# Patient Record
Sex: Male | Born: 1960
Health system: Southern US, Community
[De-identification: ages and names within clinical notes are randomized; demographics above are authoritative.]

## PROBLEM LIST (undated history)

## (undated) DIAGNOSIS — G610 Guillain-Barre syndrome: Secondary | ICD-10-CM

## (undated) DIAGNOSIS — G4730 Sleep apnea, unspecified: Secondary | ICD-10-CM

## (undated) DIAGNOSIS — C4491 Basal cell carcinoma of skin, unspecified: Secondary | ICD-10-CM

## (undated) DIAGNOSIS — I2510 Atherosclerotic heart disease of native coronary artery without angina pectoris: Secondary | ICD-10-CM

## (undated) DIAGNOSIS — I10 Essential (primary) hypertension: Secondary | ICD-10-CM

## (undated) HISTORY — PX: WRIST SURGERY: SHX841

## (undated) HISTORY — PX: KNEE SURGERY: SHX244

## (undated) HISTORY — PX: COLONOSCOPY: SHX174

## (undated) HISTORY — PX: OTHER SURGICAL HISTORY: SHX169

---
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
## 2000-01-04 ENCOUNTER — Ambulatory Visit (HOSPITAL_BASED_OUTPATIENT_CLINIC_OR_DEPARTMENT_OTHER): Admission: RE | Admit: 2000-01-04 | Discharge: 2000-01-04 | Payer: Self-pay | Admitting: Orthopedic Surgery

## 2012-07-19 ENCOUNTER — Other Ambulatory Visit: Payer: Self-pay | Admitting: Occupational Medicine

## 2012-07-19 ENCOUNTER — Ambulatory Visit
Admission: RE | Admit: 2012-07-19 | Discharge: 2012-07-19 | Disposition: A | Discharge status: Home / Self Care | Payer: No Typology Code available for payment source | Source: Ambulatory Visit | Attending: Occupational Medicine | Admitting: Occupational Medicine

## 2012-07-19 DIAGNOSIS — Z021 Encounter for pre-employment examination: Secondary | ICD-10-CM

## 2013-06-22 ENCOUNTER — Emergency Department (HOSPITAL_COMMUNITY)
Admission: EM | Admit: 2013-06-22 | Discharge: 2013-06-22 | Disposition: A | Discharge status: Home / Self Care | Payer: Worker's Compensation | Attending: Emergency Medicine | Admitting: Emergency Medicine

## 2013-06-22 ENCOUNTER — Encounter (HOSPITAL_COMMUNITY): Payer: Self-pay | Admitting: Emergency Medicine

## 2013-06-22 DIAGNOSIS — I10 Essential (primary) hypertension: Secondary | ICD-10-CM | POA: Insufficient documentation

## 2013-06-22 DIAGNOSIS — Z79899 Other long term (current) drug therapy: Secondary | ICD-10-CM | POA: Insufficient documentation

## 2013-06-22 DIAGNOSIS — R450 Nervousness: Secondary | ICD-10-CM | POA: Insufficient documentation

## 2013-06-22 DIAGNOSIS — F418 Other specified anxiety disorders: Secondary | ICD-10-CM

## 2013-06-22 DIAGNOSIS — R002 Palpitations: Secondary | ICD-10-CM | POA: Insufficient documentation

## 2013-06-22 DIAGNOSIS — F411 Generalized anxiety disorder: Secondary | ICD-10-CM | POA: Insufficient documentation

## 2013-06-22 HISTORY — DX: Essential (primary) hypertension: I10

## 2013-06-22 MED ORDER — ALPRAZOLAM 0.25 MG PO TABS: 0.2500 mg | ORAL_TABLET | Freq: Every evening | ORAL | Status: DC | PRN

## 2013-06-22 NOTE — ED Notes (Signed)
EKG given to EDP, Yelverton, MD. 
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
## 2013-12-24 ENCOUNTER — Encounter (HOSPITAL_COMMUNITY): Payer: Self-pay | Admitting: Emergency Medicine

## 2013-12-24 ENCOUNTER — Emergency Department (HOSPITAL_COMMUNITY)
Admission: EM | Admit: 2013-12-24 | Discharge: 2013-12-25 | Disposition: A | Discharge status: Home / Self Care | Payer: Worker's Compensation | Attending: Emergency Medicine | Admitting: Emergency Medicine

## 2013-12-24 DIAGNOSIS — Y9389 Activity, other specified: Secondary | ICD-10-CM | POA: Insufficient documentation

## 2013-12-24 DIAGNOSIS — S63501A Unspecified sprain of right wrist, initial encounter: Secondary | ICD-10-CM

## 2013-12-24 DIAGNOSIS — S40011A Contusion of right shoulder, initial encounter: Secondary | ICD-10-CM

## 2013-12-24 DIAGNOSIS — Y92410 Unspecified street and highway as the place of occurrence of the external cause: Secondary | ICD-10-CM | POA: Insufficient documentation

## 2013-12-24 DIAGNOSIS — S8010XA Contusion of unspecified lower leg, initial encounter: Secondary | ICD-10-CM | POA: Insufficient documentation

## 2013-12-24 DIAGNOSIS — Z79899 Other long term (current) drug therapy: Secondary | ICD-10-CM | POA: Insufficient documentation

## 2013-12-24 DIAGNOSIS — S40019A Contusion of unspecified shoulder, initial encounter: Secondary | ICD-10-CM | POA: Insufficient documentation

## 2013-12-24 DIAGNOSIS — X500XXA Overexertion from strenuous movement or load, initial encounter: Secondary | ICD-10-CM | POA: Insufficient documentation

## 2013-12-24 DIAGNOSIS — W19XXXA Unspecified fall, initial encounter: Secondary | ICD-10-CM

## 2013-12-24 DIAGNOSIS — S8012XA Contusion of left lower leg, initial encounter: Secondary | ICD-10-CM

## 2013-12-24 DIAGNOSIS — Z791 Long term (current) use of non-steroidal anti-inflammatories (NSAID): Secondary | ICD-10-CM | POA: Insufficient documentation

## 2013-12-24 DIAGNOSIS — S63509A Unspecified sprain of unspecified wrist, initial encounter: Secondary | ICD-10-CM | POA: Insufficient documentation

## 2013-12-24 DIAGNOSIS — I10 Essential (primary) hypertension: Secondary | ICD-10-CM | POA: Insufficient documentation
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
## 2013-12-25 ENCOUNTER — Emergency Department (HOSPITAL_COMMUNITY): Payer: Worker's Compensation

## 2013-12-25 MED ORDER — CYCLOBENZAPRINE HCL 10 MG PO TABS: 10.0000 mg | ORAL_TABLET | Freq: Two times a day (BID) | ORAL | Status: DC | PRN

## 2013-12-25 MED ORDER — NAPROXEN 500 MG PO TABS: 500.0000 mg | ORAL_TABLET | Freq: Two times a day (BID) | ORAL | Status: DC

## 2013-12-25 MED ORDER — NAPROXEN 500 MG PO TABS
500.0000 mg | ORAL_TABLET | Freq: Once | ORAL | Status: AC
  Administered 2013-12-25: 500 mg via ORAL
  Filled 2013-12-25: qty 1

## 2013-12-25 MED ORDER — CYCLOBENZAPRINE HCL 10 MG PO TABS
10.0000 mg | ORAL_TABLET | Freq: Once | ORAL | Status: AC
  Administered 2013-12-25: 10 mg via ORAL
  Filled 2013-12-25: qty 1
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
## 2013-12-25 NOTE — Discharge Instructions (Signed)
X-rays were all normal. You'll be sore for several days. Medication for inflammation and muscle spasm. Followup with occupational health at work. Ice pack.

## 2013-12-25 NOTE — ED Notes (Signed)
Patient transported to X-ray 
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
## 2015-04-28 ENCOUNTER — Emergency Department (HOSPITAL_BASED_OUTPATIENT_CLINIC_OR_DEPARTMENT_OTHER): Payer: Worker's Compensation

## 2015-04-28 ENCOUNTER — Encounter (HOSPITAL_BASED_OUTPATIENT_CLINIC_OR_DEPARTMENT_OTHER): Payer: Self-pay | Admitting: *Deleted

## 2015-04-28 ENCOUNTER — Emergency Department (HOSPITAL_BASED_OUTPATIENT_CLINIC_OR_DEPARTMENT_OTHER)
Admission: EM | Admit: 2015-04-28 | Discharge: 2015-04-28 | Disposition: A | Discharge status: Home / Self Care | Payer: Worker's Compensation | Attending: Emergency Medicine | Admitting: Emergency Medicine

## 2015-04-28 DIAGNOSIS — Z79899 Other long term (current) drug therapy: Secondary | ICD-10-CM | POA: Insufficient documentation

## 2015-04-28 DIAGNOSIS — Y9289 Other specified places as the place of occurrence of the external cause: Secondary | ICD-10-CM | POA: Insufficient documentation

## 2015-04-28 DIAGNOSIS — I10 Essential (primary) hypertension: Secondary | ICD-10-CM | POA: Insufficient documentation

## 2015-04-28 DIAGNOSIS — Y998 Other external cause status: Secondary | ICD-10-CM | POA: Insufficient documentation

## 2015-04-28 DIAGNOSIS — Z791 Long term (current) use of non-steroidal anti-inflammatories (NSAID): Secondary | ICD-10-CM | POA: Insufficient documentation

## 2015-04-28 DIAGNOSIS — S8992XA Unspecified injury of left lower leg, initial encounter: Secondary | ICD-10-CM | POA: Diagnosis present

## 2015-04-28 DIAGNOSIS — Y9389 Activity, other specified: Secondary | ICD-10-CM | POA: Diagnosis not present

## 2015-04-28 MED ORDER — NAPROXEN 500 MG PO TABS: 500.0000 mg | ORAL_TABLET | Freq: Two times a day (BID) | ORAL | Status: DC
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
## 2015-04-28 NOTE — Discharge Instructions (Signed)
Please read and follow all provided instructions.  Your diagnoses today include:  1. Knee injury, left, initial encounter     Tests performed today include:  An x-ray of the affected area - does NOT show any broken bones  Vital signs. See below for your results today.   Medications prescribed:   Ibuprofen (Motrin, Advil) - anti-inflammatory pain medication  Do not exceed 600mg  ibuprofen every 6 hours, take with food  You have been prescribed an anti-inflammatory medication or NSAID. Take with food. Take smallest effective dose for the shortest duration needed for your pain. Stop taking if you experience stomach pain or vomiting.   Take any prescribed medications only as directed.  Home care instructions:   Follow any educational materials contained in this packet  Follow R.I.C.E. Protocol:  R - rest your injury   I  - use ice on injury without applying directly to skin  C - compress injury with bandage or splint  E - elevate the injury as much as possible  Follow-up instructions: Please follow-up with your orthopedic physician.   Return instructions:   Please return if your toes or feet are numb or tingling, appear gray or blue, or you have severe pain (also elevate the leg and loosen splint or wrap if you were given one)  Please return to the Emergency Department if you experience worsening symptoms.   Please return if you have any other emergent concerns.  Additional Information:  Your vital signs today were: BP 152/76 mmHg   Pulse 77   Temp(Src) 98.4 F (36.9 C) (Oral)   Resp 20   Ht 6' (1.829 m)   Wt 197 lb (89.359 kg)   BMI 26.71 kg/m2   SpO2 96% If your blood pressure (BP) was elevated above 135/85 this visit, please have this repeated by your doctor within one month. --------------

## 2015-04-28 NOTE — ED Notes (Signed)
Pt. returned from XR. 
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
## 2017-02-27 DIAGNOSIS — G4733 Obstructive sleep apnea (adult) (pediatric): Secondary | ICD-10-CM | POA: Diagnosis not present

## 2017-02-27 DIAGNOSIS — Z0000 Encounter for general adult medical examination without abnormal findings: Secondary | ICD-10-CM | POA: Diagnosis not present

## 2017-02-27 DIAGNOSIS — I10 Essential (primary) hypertension: Secondary | ICD-10-CM | POA: Diagnosis not present

## 2017-03-08 ENCOUNTER — Encounter (HOSPITAL_BASED_OUTPATIENT_CLINIC_OR_DEPARTMENT_OTHER): Payer: Self-pay

## 2017-03-08 ENCOUNTER — Emergency Department (HOSPITAL_BASED_OUTPATIENT_CLINIC_OR_DEPARTMENT_OTHER): Payer: Worker's Compensation

## 2017-03-08 ENCOUNTER — Emergency Department (HOSPITAL_BASED_OUTPATIENT_CLINIC_OR_DEPARTMENT_OTHER)
Admission: EM | Admit: 2017-03-08 | Discharge: 2017-03-08 | Disposition: A | Discharge status: Home / Self Care | Payer: Worker's Compensation | Attending: Emergency Medicine | Admitting: Emergency Medicine

## 2017-03-08 DIAGNOSIS — Z79899 Other long term (current) drug therapy: Secondary | ICD-10-CM | POA: Diagnosis not present

## 2017-03-08 DIAGNOSIS — Y990 Civilian activity done for income or pay: Secondary | ICD-10-CM | POA: Diagnosis not present

## 2017-03-08 DIAGNOSIS — S8001XA Contusion of right knee, initial encounter: Secondary | ICD-10-CM

## 2017-03-08 DIAGNOSIS — T148XXA Other injury of unspecified body region, initial encounter: Secondary | ICD-10-CM | POA: Diagnosis not present

## 2017-03-08 DIAGNOSIS — Y929 Unspecified place or not applicable: Secondary | ICD-10-CM | POA: Diagnosis not present

## 2017-03-08 DIAGNOSIS — I10 Essential (primary) hypertension: Secondary | ICD-10-CM | POA: Diagnosis not present

## 2017-03-08 DIAGNOSIS — S8991XA Unspecified injury of right lower leg, initial encounter: Secondary | ICD-10-CM | POA: Insufficient documentation

## 2017-03-08 DIAGNOSIS — S4991XA Unspecified injury of right shoulder and upper arm, initial encounter: Secondary | ICD-10-CM | POA: Diagnosis present

## 2017-03-08 DIAGNOSIS — Y939 Activity, unspecified: Secondary | ICD-10-CM | POA: Insufficient documentation

## 2017-03-08 DIAGNOSIS — S43401A Unspecified sprain of right shoulder joint, initial encounter: Secondary | ICD-10-CM | POA: Diagnosis not present

## 2017-03-08 DIAGNOSIS — T07XXXA Unspecified multiple injuries, initial encounter: Secondary | ICD-10-CM

## 2017-03-08 MED ORDER — IBUPROFEN 400 MG PO TABS
400.0000 mg | ORAL_TABLET | Freq: Once | ORAL | Status: AC
  Administered 2017-03-08: 400 mg via ORAL
  Filled 2017-03-08: qty 1

## 2017-03-08 MED ORDER — BACITRACIN ZINC 500 UNIT/GM EX OINT
TOPICAL_OINTMENT | Freq: Two times a day (BID) | CUTANEOUS | Status: DC
  Administered 2017-03-08: 1 via TOPICAL
  Filled 2017-03-08: qty 28.35

## 2017-03-08 MED ORDER — TRAMADOL HCL 50 MG PO TABS: 50.0000 mg | ORAL_TABLET | Freq: Four times a day (QID) | ORAL | 0 refills | Status: DC | PRN

## 2017-03-08 MED ORDER — ACETAMINOPHEN 500 MG PO TABS
1000.0000 mg | ORAL_TABLET | Freq: Once | ORAL | Status: AC
  Administered 2017-03-08: 1000 mg via ORAL
  Filled 2017-03-08: qty 2
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
## 2017-03-08 NOTE — ED Notes (Signed)
States no drug screen needed per supervisor, Sampson Si.

## 2017-03-08 NOTE — Discharge Instructions (Signed)
It was our pleasure to provide your ER care today - we hope that you feel better.  Thank you for your service!  Rest. Keep abrasions clean/dry - you may apply a thin coat of bacitracin for the next few days.  Take motrin as need for pain - take with food.  You may also take ultram as need for pain - no driving when taking.   Wear shoulder sling for comfort/support as need in the next week.  Icepack to sore area.  As we discussed, although no bony injury is seen, you may have injured the shoulder capsule or rotator cuff - follow up with orthopedist in 1 week - call office for appointment.  Return to ER if worse, new or severe pain, numbness/weakness, other concern.
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
## 2017-06-20 DIAGNOSIS — C44311 Basal cell carcinoma of skin of nose: Secondary | ICD-10-CM | POA: Diagnosis not present

## 2017-06-20 DIAGNOSIS — L409 Psoriasis, unspecified: Secondary | ICD-10-CM | POA: Diagnosis not present

## 2017-06-20 DIAGNOSIS — L219 Seborrheic dermatitis, unspecified: Secondary | ICD-10-CM | POA: Diagnosis not present

## 2017-06-20 DIAGNOSIS — C4491 Basal cell carcinoma of skin, unspecified: Secondary | ICD-10-CM

## 2017-06-20 HISTORY — DX: Basal cell carcinoma of skin, unspecified: C44.91

## 2018-03-13 DIAGNOSIS — I10 Essential (primary) hypertension: Secondary | ICD-10-CM | POA: Diagnosis not present

## 2018-03-13 DIAGNOSIS — Z0000 Encounter for general adult medical examination without abnormal findings: Secondary | ICD-10-CM | POA: Diagnosis not present

## 2019-06-12 ENCOUNTER — Encounter: Payer: Self-pay | Admitting: *Deleted

## 2020-02-27 ENCOUNTER — Emergency Department (HOSPITAL_COMMUNITY)
Admission: EM | Admit: 2020-02-27 | Discharge: 2020-02-27 | Disposition: A | Discharge status: Home / Self Care | Payer: No Typology Code available for payment source | Attending: Emergency Medicine | Admitting: Emergency Medicine

## 2020-02-27 ENCOUNTER — Other Ambulatory Visit: Payer: Self-pay

## 2020-02-27 ENCOUNTER — Encounter (HOSPITAL_COMMUNITY): Payer: Self-pay

## 2020-02-27 DIAGNOSIS — Z79899 Other long term (current) drug therapy: Secondary | ICD-10-CM | POA: Diagnosis not present

## 2020-02-27 DIAGNOSIS — S59912A Unspecified injury of left forearm, initial encounter: Secondary | ICD-10-CM | POA: Diagnosis present

## 2020-02-27 DIAGNOSIS — Y929 Unspecified place or not applicable: Secondary | ICD-10-CM | POA: Diagnosis not present

## 2020-02-27 DIAGNOSIS — S51852A Open bite of left forearm, initial encounter: Secondary | ICD-10-CM | POA: Insufficient documentation

## 2020-02-27 DIAGNOSIS — Y990 Civilian activity done for income or pay: Secondary | ICD-10-CM | POA: Diagnosis not present

## 2020-02-27 DIAGNOSIS — I10 Essential (primary) hypertension: Secondary | ICD-10-CM | POA: Insufficient documentation

## 2020-02-27 DIAGNOSIS — Y939 Activity, unspecified: Secondary | ICD-10-CM | POA: Insufficient documentation

## 2020-02-27 DIAGNOSIS — Z85828 Personal history of other malignant neoplasm of skin: Secondary | ICD-10-CM | POA: Diagnosis not present

## 2020-02-27 DIAGNOSIS — W503XXA Accidental bite by another person, initial encounter: Secondary | ICD-10-CM

## 2020-02-27 MED ORDER — TETANUS-DIPHTH-ACELL PERTUSSIS 5-2.5-18.5 LF-MCG/0.5 IM SUSP
0.5000 mL | Freq: Once | INTRAMUSCULAR | Status: AC
  Administered 2020-02-27: 0.5 mL via INTRAMUSCULAR
  Filled 2020-02-27: qty 0.5

## 2020-02-27 MED ORDER — AMOXICILLIN-POT CLAVULANATE 875-125 MG PO TABS
1.0000 | ORAL_TABLET | Freq: Once | ORAL | Status: AC
  Administered 2020-02-27: 1 via ORAL
  Filled 2020-02-27: qty 1

## 2020-02-27 MED ORDER — AMOXICILLIN-POT CLAVULANATE 875-125 MG PO TABS: 1.0000 | ORAL_TABLET | Freq: Two times a day (BID) | ORAL | 0 refills | Status: DC

## 2020-02-27 MED ORDER — NAPROXEN 500 MG PO TABS: 500.0000 mg | ORAL_TABLET | Freq: Two times a day (BID) | ORAL | 0 refills | Status: DC | PRN

## 2020-02-27 MED ORDER — NAPROXEN 250 MG PO TABS
500.0000 mg | ORAL_TABLET | Freq: Once | ORAL | Status: AC
  Administered 2020-02-27: 500 mg via ORAL
  Filled 2020-02-27: qty 2
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
## 2020-04-10 ENCOUNTER — Emergency Department (HOSPITAL_COMMUNITY): Payer: No Typology Code available for payment source

## 2020-04-10 ENCOUNTER — Encounter (HOSPITAL_COMMUNITY): Payer: Self-pay

## 2020-04-10 ENCOUNTER — Emergency Department (HOSPITAL_COMMUNITY)
Admission: EM | Admit: 2020-04-10 | Discharge: 2020-04-10 | Disposition: A | Discharge status: Home / Self Care | Payer: No Typology Code available for payment source | Attending: Emergency Medicine | Admitting: Emergency Medicine

## 2020-04-10 ENCOUNTER — Other Ambulatory Visit: Payer: Self-pay

## 2020-04-10 DIAGNOSIS — S0093XA Contusion of unspecified part of head, initial encounter: Secondary | ICD-10-CM | POA: Insufficient documentation

## 2020-04-10 DIAGNOSIS — S1093XA Contusion of unspecified part of neck, initial encounter: Secondary | ICD-10-CM | POA: Insufficient documentation

## 2020-04-10 DIAGNOSIS — M47812 Spondylosis without myelopathy or radiculopathy, cervical region: Secondary | ICD-10-CM | POA: Diagnosis not present

## 2020-04-10 DIAGNOSIS — Y999 Unspecified external cause status: Secondary | ICD-10-CM | POA: Insufficient documentation

## 2020-04-10 DIAGNOSIS — S0083XA Contusion of other part of head, initial encounter: Secondary | ICD-10-CM

## 2020-04-10 DIAGNOSIS — M26642 Arthritis of left temporomandibular joint: Secondary | ICD-10-CM

## 2020-04-10 DIAGNOSIS — S8002XA Contusion of left knee, initial encounter: Secondary | ICD-10-CM | POA: Insufficient documentation

## 2020-04-10 DIAGNOSIS — S8001XA Contusion of right knee, initial encounter: Secondary | ICD-10-CM | POA: Diagnosis present

## 2020-04-10 DIAGNOSIS — Y9389 Activity, other specified: Secondary | ICD-10-CM | POA: Diagnosis not present

## 2020-04-10 DIAGNOSIS — Y929 Unspecified place or not applicable: Secondary | ICD-10-CM | POA: Diagnosis not present

## 2020-04-10 DIAGNOSIS — K029 Dental caries, unspecified: Secondary | ICD-10-CM

## 2020-04-10 LAB — RAPID URINE DRUG SCREEN, HOSP PERFORMED
Amphetamines: NOT DETECTED
Barbiturates: NOT DETECTED
Benzodiazepines: NOT DETECTED
Cocaine: NOT DETECTED
Opiates: NOT DETECTED
Tetrahydrocannabinol: NOT DETECTED

## 2020-04-10 MED ORDER — ACETAMINOPHEN 500 MG PO TABS
1000.0000 mg | ORAL_TABLET | Freq: Once | ORAL | Status: AC
  Administered 2020-04-10: 1000 mg via ORAL
  Filled 2020-04-10: qty 2
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
## 2020-04-10 NOTE — ED Notes (Signed)
Patient verbalizes understanding of discharge instructions. Opportunity for questioning and answers were provided. Armband removed by staff, pt discharged from ED stable & ambulatory  

## 2020-04-10 NOTE — ED Notes (Signed)
Patient transported to CT 

## 2020-04-10 NOTE — ED Notes (Signed)
Patient transported to X-ray 
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
## 2020-04-10 NOTE — ED Notes (Signed)
[employer] remains at bedside.

## 2020-04-10 NOTE — ED Notes (Signed)
Pt has arrived back to his room and EDP Haviland came to bedside & cleared him to have his C-Collar removed. Pt rated his pain a 6/10 and the Tylenol was not affective, denies wanting anything else for pain. Will continue to monitor.

## 2020-04-13 ENCOUNTER — Encounter (HOSPITAL_COMMUNITY): Payer: Self-pay

## 2020-09-16 ENCOUNTER — Other Ambulatory Visit: Payer: Self-pay | Admitting: Physician Assistant

## 2022-08-22 HISTORY — PX: CARDIAC CATHETERIZATION: SHX172

## 2023-11-28 ENCOUNTER — Other Ambulatory Visit: Payer: Self-pay | Admitting: Neurological Surgery

## 2023-11-29 NOTE — Progress Notes (Signed)
 Surgical Instructions    Your procedure is scheduled on Friday, April 25th, 2025.   Report to [facility] Main Entrance "A" at 05:30 A.M., then check in with the Admitting office.  Call this number if you have problems the morning of surgery:  [phone]   If you have any questions prior to your surgery date call [phone]: Open Monday-Friday 8am-4pm If you experience any cold or flu symptoms such as cough, fever, chills, shortness of breath, etc. between now and your scheduled surgery, please notify us at the above number     Remember:  Do not eat or drink after midnight the night before your surgery    Take these medicines the morning of surgery with A SIP OF WATER:   atorvastatin (LIPITOR)  Amlodipine  traMADol (ULTRAM) - as needed  As of today, STOP taking any Aspirin (unless otherwise instructed by your surgeon) Aleve, Naproxen, Ibuprofen, Motrin, Advil, Goody's, BC's, all herbal medications, fish oil, and all vitamins.   The day of surgery:          Do not wear jewelry  Do not wear lotions, powders, cologne or deodorant. Men may shave face and neck. Do not bring valuables to the hospital.   [facility] is not responsible for any belongings or valuables.    Do NOT Smoke (Tobacco/Vaping)  24 hours prior to your procedure  If you use a CPAP at night, you may bring your mask for your overnight stay.   Contacts, glasses, hearing aids, dentures or partials may not be worn into surgery, please bring cases for these belongings   For patients admitted to the hospital, discharge time will be determined by your treatment team.   Patients discharged the day of surgery will not be allowed to drive home, and someone needs to stay with them for 24 hours.   SURGICAL WAITING ROOM VISITATION Patients having surgery or a procedure may have no more than 2 support people in the waiting area - these visitors may rotate.   Children under the age of 9 must have an adult with them  who is not the patient. If the patient needs to stay at the hospital during part of their recovery, the visitor guidelines for inpatient rooms apply. Pre-op nurse will coordinate an appropriate time for 1 support person to accompany patient in pre-op.  This support person may not rotate.   Please refer to [email or web address] for the visitor guidelines for Inpatients (after your surgery is over and you are in a regular room).    Special instructions:    Oral Hygiene is also important to reduce your risk of infection.  Remember - BRUSH YOUR TEETH THE MORNING OF SURGERY WITH YOUR REGULAR TOOTHPASTE     Pre-operative 5 CHG Bathing Instructions    You can play a key role in reducing the risk of infection after surgery. Your skin needs to be as free of germs as possible. You can reduce the number of germs on your skin by washing with CHG (chlorhexidine gluconate) soap before surgery. CHG is an antiseptic soap that kills germs and continues to kill germs even after washing.    DO NOT use if you have an allergy to chlorhexidine/CHG or antibacterial soaps. If your skin becomes reddened or irritated, stop using the CHG and notify one of our RNs at [phone].    Please shower with the CHG soap starting 4 days before surgery using the following schedule:       Please keep in mind  the following:  DO NOT shave, including legs and underarms, starting the day of your first shower.   You may shave your face at any point before/day of surgery.  Place clean sheets on your bed the day you start using CHG soap. Use a clean washcloth (not used since being washed) for each shower. DO NOT sleep with pets once you start using the CHG.    CHG Shower Instructions:  Wash your face and private area with normal soap. If you choose to wash your hair, wash first with your normal shampoo.  After you use shampoo/soap, rinse your hair and body thoroughly to remove  shampoo/soap residue.  Turn the water OFF and apply about 3 tablespoons (45 ml) of CHG soap to a CLEAN washcloth.  Apply CHG soap ONLY FROM YOUR NECK DOWN TO YOUR TOES (washing for 3-5 minutes)  DO NOT use CHG soap on face, private areas, open wounds, or sores.  Pay special attention to the area where your surgery is being performed.  If you are having back surgery, having someone wash your back for you may be helpful. Wait 2 minutes after CHG soap is applied, then you may rinse off the CHG soap.  Pat dry with a clean towel  Put on clean clothes/pajamas   If you choose to wear lotion, please use ONLY the CHG-compatible lotions that are listed below.   Additional instructions for the day of surgery: DO NOT APPLY any lotions, deodorants, cologne, or perfumes.   Do not bring valuables to the hospital. [facility] is not responsible for any belongings/valuables. Do not wear nail polish, gel polish, artificial nails, or any other type of covering on natural nails (fingers and toes) Do not wear jewelry or makeup Put on clean/comfortable clothes.  Please brush your teeth.  Ask your nurse before applying any prescription medications to the skin.        CHG Compatible Lotions    Aveeno Moisturizing lotion  Cetaphil Moisturizing Cream  Cetaphil Moisturizing Lotion  Clairol Herbal Essence Moisturizing Lotion, Dry Skin  Clairol Herbal Essence Moisturizing Lotion, Extra Dry Skin  Clairol Herbal Essence Moisturizing Lotion, Normal Skin  Curel Age Defying Therapeutic Moisturizing Lotion with Alpha Hydroxy  Curel Extreme Care Body Lotion  Curel Soothing Hands Moisturizing Hand Lotion  Curel Therapeutic Moisturizing Cream, Fragrance-Free  Curel Therapeutic Moisturizing Lotion, Fragrance-Free  Curel Therapeutic Moisturizing Lotion, Original Formula  Eucerin Daily Replenishing Lotion  Eucerin Dry Skin Therapy Plus Alpha Hydroxy Crme  Eucerin Dry Skin Therapy Plus Alpha Hydroxy Lotion  Eucerin  Original Crme  Eucerin Original Lotion  Eucerin Plus Crme Eucerin Plus Lotion  Eucerin TriLipid Replenishing Lotion  Keri Anti-Bacterial Hand Lotion  Keri Deep Conditioning Original Lotion Dry Skin Formula Softly Scented  Keri Deep Conditioning Original Lotion, Fragrance Free Sensitive Skin Formula  Keri Lotion Fast Absorbing Fragrance Free Sensitive Skin Formula  Keri Lotion Fast Absorbing Softly Scented Dry Skin Formula  Keri Original Lotion  Keri Skin Renewal Lotion Keri Silky Smooth Lotion  Keri Silky Smooth Sensitive Skin Lotion  Nivea Body Creamy Conditioning Oil  Nivea Body Extra Enriched Lotion  Nivea Body Original Lotion  Nivea Body Sheer Moisturizing Lotion Nivea Crme  Nivea Skin Firming Lotion  NutraDerm 30 Skin Lotion  NutraDerm Skin Lotion  NutraDerm Therapeutic Skin Cream  NutraDerm Therapeutic Skin Lotion  ProShield Protective Hand Cream  Provon moisturizing lotion     If you received a COVID test during your pre-op visit, it is requested that you wear  a mask when out in public, stay away from anyone that may not be feeling well, and notify your surgeon if you develop symptoms. If you have been in contact with anyone that has tested positive in the last 10 days, please notify your surgeon.    Please read over the following fact sheets that you were given.

## 2023-11-30 ENCOUNTER — Encounter (HOSPITAL_COMMUNITY)
Admission: RE | Admit: 2023-11-30 | Discharge: 2023-11-30 | Disposition: A | Discharge status: Home / Self Care | Source: Ambulatory Visit | Attending: Neurological Surgery | Admitting: Neurological Surgery

## 2023-11-30 ENCOUNTER — Encounter (HOSPITAL_COMMUNITY): Payer: Self-pay

## 2023-11-30 ENCOUNTER — Other Ambulatory Visit: Payer: Self-pay

## 2023-11-30 DIAGNOSIS — Z01818 Encounter for other preprocedural examination: Secondary | ICD-10-CM | POA: Diagnosis present

## 2023-11-30 DIAGNOSIS — I10 Essential (primary) hypertension: Secondary | ICD-10-CM | POA: Diagnosis not present

## 2023-11-30 DIAGNOSIS — Z8669 Personal history of other diseases of the nervous system and sense organs: Secondary | ICD-10-CM | POA: Insufficient documentation

## 2023-11-30 DIAGNOSIS — M5412 Radiculopathy, cervical region: Secondary | ICD-10-CM | POA: Insufficient documentation

## 2023-11-30 DIAGNOSIS — G4733 Obstructive sleep apnea (adult) (pediatric): Secondary | ICD-10-CM | POA: Diagnosis not present

## 2023-11-30 DIAGNOSIS — I2510 Atherosclerotic heart disease of native coronary artery without angina pectoris: Secondary | ICD-10-CM | POA: Insufficient documentation

## 2023-11-30 DIAGNOSIS — Z85828 Personal history of other malignant neoplasm of skin: Secondary | ICD-10-CM | POA: Insufficient documentation

## 2023-11-30 HISTORY — DX: Guillain-Barre syndrome: G61.0

## 2023-11-30 HISTORY — DX: Sleep apnea, unspecified: G47.30

## 2023-11-30 HISTORY — DX: Atherosclerotic heart disease of native coronary artery without angina pectoris: I25.10

## 2023-11-30 LAB — BASIC METABOLIC PANEL WITH GFR
Anion gap: 11 (ref 5–15)
BUN: 22 mg/dL (ref 8–23)
CO2: 24 mmol/L (ref 22–32)
Calcium: 9.4 mg/dL (ref 8.9–10.3)
Chloride: 101 mmol/L (ref 98–111)
Creatinine, Ser: 1 mg/dL (ref 0.61–1.24)
GFR, Estimated: >60 mL/min (ref >60)
Glucose, Bld: 120 mg/dL — ABNORMAL HIGH (ref 70–99)
Potassium: 3.2 mmol/L — ABNORMAL LOW (ref 3.5–5.1)
Sodium: 136 mmol/L (ref 135–145)

## 2023-11-30 LAB — CBC
HCT: 39.5 % (ref 39.0–52.0)
Hemoglobin: 14 g/dL (ref 13.0–17.0)
MCH: 31.5 pg (ref 26.0–34.0)
MCHC: 35.4 g/dL (ref 30.0–36.0)
MCV: 88.8 fL (ref 80.0–100.0)
Platelets: 242 10*3/uL (ref 150–400)
RBC: 4.45 MIL/uL (ref 4.22–5.81)
RDW: 12.3 % (ref 11.5–15.5)
WBC: 10.4 10*3/uL (ref 4.0–10.5)
nRBC: 0 % (ref 0.0–0.2)

## 2023-11-30 LAB — SURGICAL PCR SCREEN
MRSA, PCR: NEGATIVE
Staphylococcus aureus: POSITIVE — AB

## 2023-11-30 NOTE — Progress Notes (Deleted)
 Abnormal UA in PAT. Dr. Fulton Job office was notified Dany Dyke, RN)

## 2023-11-30 NOTE — Progress Notes (Addendum)
 PCP - Acevedo, Tania, MD Cardiologist - Devona Foil, RN  PPM/ICD - denies Device Orders - n/a Rep Notified - n/a  Chest x-ray - 04/10/20 EKG - DOS Stress Test - denies ECHO - denies Cardiac Cath - 08/22/22 - [facility]  Sleep Study - yes, positive for OSA CPAP - not wearing  Blood Thinner Instructions: n/a  Aspirin Instructions - patient was instructed - As of today, STOP taking any Aspirin (unless otherwise instructed by your surgeon) Aleve, Naproxen, Ibuprofen, Motrin, Advil, Goody's, BC's, all herbal medications, fish oil, and all vitamins.   ERAS Protcol - n/a   COVID TEST- n/a   Anesthesia review: yes  Patient denies shortness of breath, fever, cough and chest pain at PAT appointment   All instructions explained to the patient, with a verbal understanding of the material. Patient agrees to go over the instructions while at home for a better understanding. Patient also instructed to self quarantine after being tested for COVID-19. The opportunity to ask questions was provided.

## 2023-12-01 NOTE — Progress Notes (Signed)
 Anesthesia Chart Review:  Case: [id number] Date/Time: 12/08/23 0715   Procedure: ANTERIOR CERVICAL DECOMPRESSION/DISCECTOMY FUSION 2 LEVELS - ACDF - C5-C6 - C6-C7   Anesthesia type: General   Diagnosis: Radiculopathy, cervical region [M54.12]   Pre-op diagnosis: Radiculopathy, cervical region   [facility]: [facility] ROOM [facility] / [facility] OR   Surgeons: Joaquin Mulberry, MD       DISCUSSION: Patient is a 63 year old male scheduled for the above procedure.  History includes never smoker, HTN, OSA (does not use CPAP), Guillain Barr syndrome (age 44), skin cancer (BCC 2018), left knee ACL repair (~ 2015), right rotator cuff repair (2018).   I called and spoke with Mr. Loughry. He denied chest pain or SOB. He says he is actually scheduled for a preoperative cardiology visit with Dr. Rosslyn Coons on 12/05/23. He had non-obstructive CAD (< 30% LM, RCA, 30-50% LAD, ~ 50% D1) in January 2024 following an abnormal stress test.   Will leave chart for follow-up.   VS: BP (!) (P) 149/75   Pulse (P) 72   Temp (P) 36.8 C   Resp (P) 17   Ht (P) 5\' 11"  (1.803 m)   Wt (P) 99.8 kg   SpO2 (P) 100%   BMI (P) 30.68 kg/m   PROVIDERS: Acevedo, Tania M, MD is PCP Elnoria Hails, [place]) Devona Foil, MD is cardiologist    LABS: Labs reviewed: Acceptable for surgery. (all labs ordered are listed, but only abnormal results are displayed)  Labs Reviewed  SURGICAL PCR SCREEN - Abnormal; Notable for the following components:      Result Value   Staphylococcus aureus POSITIVE (*)    All other components within normal limits  BASIC METABOLIC PANEL WITH GFR - Abnormal; Notable for the following components:   Potassium 3.2 (*)    Glucose, Bld 120 (*)    All other components within normal limits  CBC    EKG: 11/30/23: NSR   CV: Cardiac cath 08/22/22 (Rajper, Judie Noun, MD, [facility], see CE): LM    <30% mid LAD    poximal 30-50%, mid-distal 30-50% diffuse, ~50% ostial D1 LCX    mild  RCA   <30% ostial and proximal,  otherwise mild   Dominance  right  Anomalies   none  Hemodynamics LVEDP  8 mmHg  Hemostasis: Successful hemostasis using TR compression bandwas performed.  Impression: - Non-obstructive CAD  Recommendations: Aspirin 81 mg daily indefinitely  High-potency statin therapy   Past Medical History:  Diagnosis Date   Basal cell carcinoma 06/20/2017   Left side nose   CAD in native artery    Guillain Barr syndrome [facility])    age 89   Hypertension    Sleep apnea     Past Surgical History:  Procedure Laterality Date   COLONOSCOPY     KNEE SURGERY     rotator cuff repair Right    2018   WRIST SURGERY      MEDICATIONS:  amLODipine (NORVASC) 10 MG tablet   chlorthalidone (HYGROTON) 50 MG tablet   telmisartan (MICARDIS) 80 MG tablet   atorvastatin (LIPITOR) 10 MG tablet   Clobetasol Propionate (TEMOVATE) 0.05 % external spray   Multiple Vitamin (MULTIVITAMIN WITH MINERALS) TABS tablet   traMADol  (ULTRAM ) 50 MG tablet   No current facility-administered medications for this encounter.     Ella Gun, PA-C Surgical Short Stay/Anesthesiology [facility] Phone [phone] [facility] Phone [phone] 12/01/2023 3:46 PM

## 2023-12-07 ENCOUNTER — Encounter (HOSPITAL_COMMUNITY): Payer: Self-pay

## 2023-12-07 NOTE — Anesthesia Preprocedure Evaluation (Signed)
Anesthesia Evaluation    Airway        Dental   Pulmonary           Cardiovascular hypertension,      Neuro/Psych    GI/Hepatic   Endo/Other    Renal/GU      Musculoskeletal   Abdominal   Peds  Hematology   Anesthesia Other Findings   Reproductive/Obstetrics                             Anesthesia Physical Anesthesia Plan  ASA:   Anesthesia Plan:    Post-op Pain Management:    Induction:   PONV Risk Score and Plan:   Airway Management Planned:   Additional Equipment:   Intra-op Plan:   Post-operative Plan:   Informed Consent:   Plan Discussed with:   Anesthesia Plan Comments: (PAT note written by Tambria Pfannenstiel, PA-C.  )       Anesthesia Quick Evaluation  

## 2023-12-08 ENCOUNTER — Encounter (HOSPITAL_COMMUNITY)
Admission: RE | Disposition: A | Discharge status: Home / Self Care | Payer: Self-pay | Source: Home / Self Care | Attending: Neurological Surgery

## 2023-12-08 ENCOUNTER — Ambulatory Visit (HOSPITAL_COMMUNITY)

## 2023-12-08 ENCOUNTER — Encounter (HOSPITAL_COMMUNITY): Payer: Self-pay | Admitting: Neurological Surgery

## 2023-12-08 ENCOUNTER — Other Ambulatory Visit (HOSPITAL_COMMUNITY): Payer: Self-pay

## 2023-12-08 ENCOUNTER — Other Ambulatory Visit: Payer: Self-pay

## 2023-12-08 ENCOUNTER — Ambulatory Visit (HOSPITAL_COMMUNITY): Payer: Self-pay | Admitting: Vascular Surgery

## 2023-12-08 ENCOUNTER — Ambulatory Visit (HOSPITAL_BASED_OUTPATIENT_CLINIC_OR_DEPARTMENT_OTHER): Payer: Worker's Compensation

## 2023-12-08 ENCOUNTER — Observation Stay (HOSPITAL_COMMUNITY)
Admission: RE | Admit: 2023-12-08 | Discharge: 2023-12-08 | Disposition: A | Discharge status: Home / Self Care | Payer: Worker's Compensation | Attending: Neurological Surgery | Admitting: Neurological Surgery

## 2023-12-08 DIAGNOSIS — M50123 Cervical disc disorder at C6-C7 level with radiculopathy: Secondary | ICD-10-CM | POA: Diagnosis not present

## 2023-12-08 DIAGNOSIS — M50122 Cervical disc disorder at C5-C6 level with radiculopathy: Secondary | ICD-10-CM | POA: Diagnosis not present

## 2023-12-08 DIAGNOSIS — M4802 Spinal stenosis, cervical region: Secondary | ICD-10-CM | POA: Insufficient documentation

## 2023-12-08 DIAGNOSIS — I10 Essential (primary) hypertension: Secondary | ICD-10-CM | POA: Diagnosis not present

## 2023-12-08 DIAGNOSIS — Z79899 Other long term (current) drug therapy: Secondary | ICD-10-CM | POA: Insufficient documentation

## 2023-12-08 DIAGNOSIS — Z85828 Personal history of other malignant neoplasm of skin: Secondary | ICD-10-CM | POA: Insufficient documentation

## 2023-12-08 DIAGNOSIS — Z981 Arthrodesis status: Principal | ICD-10-CM

## 2023-12-08 DIAGNOSIS — M4722 Other spondylosis with radiculopathy, cervical region: Secondary | ICD-10-CM | POA: Diagnosis present

## 2023-12-08 DIAGNOSIS — I2510 Atherosclerotic heart disease of native coronary artery without angina pectoris: Secondary | ICD-10-CM

## 2023-12-08 DIAGNOSIS — Z01818 Encounter for other preprocedural examination: Secondary | ICD-10-CM

## 2023-12-08 HISTORY — PX: ANTERIOR CERVICAL DECOMP/DISCECTOMY FUSION: SHX1161

## 2023-12-08 LAB — PROTIME-INR
INR: 1.2 (ref 0.8–1.2)
Prothrombin Time: 15.1 s (ref 11.4–15.2)

## 2023-12-08 SURGERY — ANTERIOR CERVICAL DECOMPRESSION/DISCECTOMY FUSION 2 LEVELS
Anesthesia: General

## 2023-12-08 MED ORDER — IRBESARTAN 75 MG PO TABS: 75.0000 mg | ORAL_TABLET | Freq: Every day | ORAL | Status: DC

## 2023-12-08 MED ORDER — MENTHOL 3 MG MT LOZG: 1.0000 | LOZENGE | OROMUCOSAL | Status: DC | PRN

## 2023-12-08 MED ORDER — LACTATED RINGERS IV SOLN: INTRAVENOUS | Status: DC | PRN

## 2023-12-08 MED ORDER — CHLORHEXIDINE GLUCONATE 0.12 % MT SOLN
OROMUCOSAL | Status: AC
  Administered 2023-12-08: 15 mL via OROMUCOSAL
  Filled 2023-12-08: qty 15

## 2023-12-08 MED ORDER — BUPIVACAINE HCL (PF) 0.25 % IJ SOLN
INTRAMUSCULAR | Status: DC | PRN
  Administered 2023-12-08: 6 mL

## 2023-12-08 MED ORDER — PROPOFOL 10 MG/ML IV BOLUS
INTRAVENOUS | Status: AC
  Filled 2023-12-08: qty 20

## 2023-12-08 MED ORDER — SODIUM CHLORIDE 0.9% FLUSH: 3.0000 mL | Freq: Two times a day (BID) | INTRAVENOUS | Status: DC

## 2023-12-08 MED ORDER — ACETAMINOPHEN 650 MG RE SUPP: 650.0000 mg | RECTAL | Status: DC | PRN

## 2023-12-08 MED ORDER — METHOCARBAMOL 1000 MG/10ML IJ SOLN: 500.0000 mg | Freq: Four times a day (QID) | INTRAMUSCULAR | Status: DC | PRN

## 2023-12-08 MED ORDER — ONDANSETRON HCL 4 MG/2ML IJ SOLN
INTRAMUSCULAR | Status: DC | PRN
  Administered 2023-12-08: 4 mg via INTRAVENOUS

## 2023-12-08 MED ORDER — CEFAZOLIN SODIUM-DEXTROSE 2-4 GM/100ML-% IV SOLN
2.0000 g | INTRAVENOUS | Status: AC
  Administered 2023-12-08: 2 g via INTRAVENOUS

## 2023-12-08 MED ORDER — GABAPENTIN 300 MG PO CAPS: 300.0000 mg | ORAL_CAPSULE | ORAL | Status: AC

## 2023-12-08 MED ORDER — AMLODIPINE BESYLATE 10 MG PO TABS: 10.0000 mg | ORAL_TABLET | Freq: Every day | ORAL | Status: DC

## 2023-12-08 MED ORDER — SODIUM CHLORIDE 0.9% FLUSH: 3.0000 mL | INTRAVENOUS | Status: DC | PRN

## 2023-12-08 MED ORDER — PHENOL 1.4 % MT LIQD: 1.0000 | OROMUCOSAL | Status: DC | PRN

## 2023-12-08 MED ORDER — DEXAMETHASONE SODIUM PHOSPHATE 4 MG/ML IJ SOLN: 4.0000 mg | Freq: Four times a day (QID) | INTRAMUSCULAR | Status: DC

## 2023-12-08 MED ORDER — DEXAMETHASONE SODIUM PHOSPHATE 10 MG/ML IJ SOLN
INTRAMUSCULAR | Status: DC | PRN
  Administered 2023-12-08: 10 mg via INTRAVENOUS

## 2023-12-08 MED ORDER — ROCURONIUM BROMIDE 10 MG/ML (PF) SYRINGE
PREFILLED_SYRINGE | INTRAVENOUS | Status: DC | PRN
  Administered 2023-12-08: 10 mg via INTRAVENOUS
  Administered 2023-12-08: 70 mg via INTRAVENOUS

## 2023-12-08 MED ORDER — MIDAZOLAM HCL 2 MG/2ML IJ SOLN
INTRAMUSCULAR | Status: AC
  Filled 2023-12-08: qty 2

## 2023-12-08 MED ORDER — CHLORHEXIDINE GLUCONATE 4 % EX SOLN: 1.0000 | CUTANEOUS | 1 refills | Status: DC

## 2023-12-08 MED ORDER — CHLORHEXIDINE GLUCONATE CLOTH 2 % EX PADS
6.0000 | MEDICATED_PAD | Freq: Once | CUTANEOUS | Status: DC
Start: 2023-12-08 — End: 2023-12-08

## 2023-12-08 MED ORDER — KETAMINE HCL 10 MG/ML IJ SOLN
INTRAMUSCULAR | Status: DC | PRN
  Administered 2023-12-08 (×2): 10 mg via INTRAVENOUS
  Administered 2023-12-08: 30 mg via INTRAVENOUS

## 2023-12-08 MED ORDER — DEXAMETHASONE 4 MG PO TABS
4.0000 mg | ORAL_TABLET | Freq: Four times a day (QID) | ORAL | Status: DC
  Administered 2023-12-08 (×2): 4 mg via ORAL
  Filled 2023-12-08 (×2): qty 1

## 2023-12-08 MED ORDER — ONDANSETRON HCL 4 MG PO TABS: 4.0000 mg | ORAL_TABLET | Freq: Four times a day (QID) | ORAL | Status: DC | PRN

## 2023-12-08 MED ORDER — SENNA 8.6 MG PO TABS: 1.0000 | ORAL_TABLET | Freq: Two times a day (BID) | ORAL | Status: DC

## 2023-12-08 MED ORDER — PROPOFOL 10 MG/ML IV BOLUS
INTRAVENOUS | Status: DC | PRN
  Administered 2023-12-08: 20 mg via INTRAVENOUS
  Administered 2023-12-08: 180 mg via INTRAVENOUS

## 2023-12-08 MED ORDER — BUPIVACAINE HCL (PF) 0.25 % IJ SOLN
INTRAMUSCULAR | Status: AC
  Filled 2023-12-08: qty 30

## 2023-12-08 MED ORDER — 0.9 % SODIUM CHLORIDE (POUR BTL) OPTIME
TOPICAL | Status: DC | PRN
  Administered 2023-12-08: 1000 mL

## 2023-12-08 MED ORDER — SUGAMMADEX SODIUM 200 MG/2ML IV SOLN
INTRAVENOUS | Status: DC | PRN
  Administered 2023-12-08: 200 mg via INTRAVENOUS

## 2023-12-08 MED ORDER — ACETAMINOPHEN 500 MG PO TABS: 1000.0000 mg | ORAL_TABLET | ORAL | Status: AC

## 2023-12-08 MED ORDER — PHENYLEPHRINE HCL-NACL 20-0.9 MG/250ML-% IV SOLN
INTRAVENOUS | Status: DC | PRN
  Administered 2023-12-08: 25 ug/min via INTRAVENOUS

## 2023-12-08 MED ORDER — ACETAMINOPHEN 325 MG PO TABS: 650.0000 mg | ORAL_TABLET | ORAL | Status: DC | PRN

## 2023-12-08 MED ORDER — KETAMINE HCL 50 MG/5ML IJ SOSY
PREFILLED_SYRINGE | INTRAMUSCULAR | Status: AC
  Filled 2023-12-08: qty 5

## 2023-12-08 MED ORDER — VASOPRESSIN 20 UNIT/ML IV SOLN
INTRAVENOUS | Status: AC
  Filled 2023-12-08: qty 1

## 2023-12-08 MED ORDER — CHLORTHALIDONE 25 MG PO TABS: 50.0000 mg | ORAL_TABLET | Freq: Every day | ORAL | Status: DC

## 2023-12-08 MED ORDER — THROMBIN 5000 UNITS EX KIT
PACK | CUTANEOUS | Status: AC
  Filled 2023-12-08: qty 1

## 2023-12-08 MED ORDER — LIDOCAINE 2% (20 MG/ML) 5 ML SYRINGE
INTRAMUSCULAR | Status: DC | PRN
  Administered 2023-12-08: 20 mg via INTRAVENOUS
  Administered 2023-12-08 (×2): 40 mg via INTRAVENOUS

## 2023-12-08 MED ORDER — SODIUM CHLORIDE 0.9% FLUSH
3.0000 mL | Freq: Two times a day (BID) | INTRAVENOUS | Status: DC
  Administered 2023-12-08: 3 mL via INTRAVENOUS

## 2023-12-08 MED ORDER — PHENYLEPHRINE 80 MCG/ML (10ML) SYRINGE FOR IV PUSH (FOR BLOOD PRESSURE SUPPORT)
PREFILLED_SYRINGE | INTRAVENOUS | Status: DC | PRN
  Administered 2023-12-08 (×2): 40 ug via INTRAVENOUS

## 2023-12-08 MED ORDER — CEFAZOLIN SODIUM-DEXTROSE 2-4 GM/100ML-% IV SOLN
2.0000 g | Freq: Three times a day (TID) | INTRAVENOUS | Status: DC
  Administered 2023-12-08: 2 g via INTRAVENOUS
  Filled 2023-12-08: qty 100

## 2023-12-08 MED ORDER — CEFAZOLIN SODIUM-DEXTROSE 2-4 GM/100ML-% IV SOLN
INTRAVENOUS | Status: AC
  Filled 2023-12-08: qty 100

## 2023-12-08 MED ORDER — MUPIROCIN 2 % EX OINT: 1.0000 | TOPICAL_OINTMENT | Freq: Two times a day (BID) | CUTANEOUS | 0 refills | Status: DC

## 2023-12-08 MED ORDER — METHOCARBAMOL 500 MG PO TABS
500.0000 mg | ORAL_TABLET | Freq: Four times a day (QID) | ORAL | 1 refills | Status: AC | PRN
  Filled 2023-12-08: qty 60, 15d supply, fill #0

## 2023-12-08 MED ORDER — ORAL CARE MOUTH RINSE: 15.0000 mL | Freq: Once | OROMUCOSAL | Status: AC

## 2023-12-08 MED ORDER — THROMBIN 5000 UNITS EX KIT
PACK | CUTANEOUS | Status: AC
  Filled 2023-12-08: qty 2

## 2023-12-08 MED ORDER — ONDANSETRON HCL 4 MG/2ML IJ SOLN: 4.0000 mg | Freq: Four times a day (QID) | INTRAMUSCULAR | Status: DC | PRN

## 2023-12-08 MED ORDER — FENTANYL CITRATE (PF) 250 MCG/5ML IJ SOLN
INTRAMUSCULAR | Status: AC
  Filled 2023-12-08: qty 5

## 2023-12-08 MED ORDER — PHENYLEPHRINE HCL-NACL 20-0.9 MG/250ML-% IV SOLN
INTRAVENOUS | Status: AC
  Filled 2023-12-08: qty 250

## 2023-12-08 MED ORDER — ACETAMINOPHEN 10 MG/ML IV SOLN
INTRAVENOUS | Status: AC
Start: 2023-12-08 — End: ?
  Filled 2023-12-08: qty 200

## 2023-12-08 MED ORDER — OXYCODONE HCL 5 MG PO TABS
5.0000 mg | ORAL_TABLET | ORAL | 0 refills | Status: AC | PRN
  Filled 2023-12-08: qty 30, 5d supply, fill #0

## 2023-12-08 MED ORDER — THROMBIN 5000 UNITS EX SOLR
OROMUCOSAL | Status: DC | PRN
  Administered 2023-12-08: 5 mL via TOPICAL

## 2023-12-08 MED ORDER — SODIUM CHLORIDE 0.9 % IV SOLN
250.0000 mL | INTRAVENOUS | Status: DC
  Administered 2023-12-08: 250 mL via INTRAVENOUS

## 2023-12-08 MED ORDER — ACETAMINOPHEN 500 MG PO TABS
ORAL_TABLET | ORAL | Status: AC
  Administered 2023-12-08: 1000 mg via ORAL
  Filled 2023-12-08: qty 2

## 2023-12-08 MED ORDER — SODIUM CHLORIDE 0.9 % IV SOLN: INTRAVENOUS | Status: DC | PRN

## 2023-12-08 MED ORDER — METHOCARBAMOL 500 MG PO TABS: 500.0000 mg | ORAL_TABLET | Freq: Four times a day (QID) | ORAL | Status: DC | PRN

## 2023-12-08 MED ORDER — MORPHINE SULFATE (PF) 2 MG/ML IV SOLN: 2.0000 mg | INTRAVENOUS | Status: DC | PRN

## 2023-12-08 MED ORDER — CHLORHEXIDINE GLUCONATE 0.12 % MT SOLN: 15.0000 mL | Freq: Once | OROMUCOSAL | Status: AC

## 2023-12-08 MED ORDER — EPHEDRINE SULFATE-NACL 50-0.9 MG/10ML-% IV SOSY
PREFILLED_SYRINGE | INTRAVENOUS | Status: DC | PRN
  Administered 2023-12-08: 5 mg via INTRAVENOUS

## 2023-12-08 MED ORDER — GABAPENTIN 300 MG PO CAPS
ORAL_CAPSULE | ORAL | Status: AC
  Administered 2023-12-08: 300 mg via ORAL
  Filled 2023-12-08: qty 1

## 2023-12-08 MED ORDER — FENTANYL CITRATE (PF) 250 MCG/5ML IJ SOLN
INTRAMUSCULAR | Status: DC | PRN
  Administered 2023-12-08: 100 ug via INTRAVENOUS
  Administered 2023-12-08 (×2): 50 ug via INTRAVENOUS

## 2023-12-08 MED ORDER — OXYCODONE HCL 5 MG PO TABS: 5.0000 mg | ORAL_TABLET | ORAL | Status: DC | PRN

## 2023-12-08 MED ORDER — MIDAZOLAM HCL 2 MG/2ML IJ SOLN
INTRAMUSCULAR | Status: DC | PRN
  Administered 2023-12-08: 2 mg via INTRAVENOUS

## 2023-12-08 SURGICAL SUPPLY — 47 items
BAG COUNTER SPONGE SURGICOUNT (BAG) ×1 IMPLANT
BAND RUBBER #18 3X1/16 STRL (MISCELLANEOUS) ×2 IMPLANT
BASKET BONE COLLECTION (BASKET) ×1 IMPLANT
BENZOIN TINCTURE PRP APPL 2/3 (GAUZE/BANDAGES/DRESSINGS) ×1 IMPLANT
BIT DRILL 2.3 STRL 12 (BIT) IMPLANT
BUR CARBIDE MATCH 3.0 (BURR) ×1 IMPLANT
CANISTER SUCT 3000ML PPV (MISCELLANEOUS) ×1 IMPLANT
DRAPE C-ARM 42X72 X-RAY (DRAPES) ×2 IMPLANT
DRAPE LAPAROTOMY 100X72 PEDS (DRAPES) ×1 IMPLANT
DRAPE MICROSCOPE SLANT 54X150 (MISCELLANEOUS) IMPLANT
DRSG OPSITE POSTOP 4X6 (GAUZE/BANDAGES/DRESSINGS) IMPLANT
DURAPREP 6ML APPLICATOR 50/CS (WOUND CARE) ×1 IMPLANT
ELECT COATED BLADE 2.86 ST (ELECTRODE) ×1 IMPLANT
ELECTRODE REM PT RTRN 9FT ADLT (ELECTROSURGICAL) ×1 IMPLANT
GAUZE 4X4 16PLY ~~LOC~~+RFID DBL (SPONGE) IMPLANT
GLOVE BIO SURGEON STRL SZ7 (GLOVE) ×1 IMPLANT
GLOVE BIO SURGEON STRL SZ8 (GLOVE) ×1 IMPLANT
GLOVE BIOGEL PI IND STRL 7.0 (GLOVE) ×1 IMPLANT
GOWN STRL REUS W/ TWL LRG LVL3 (GOWN DISPOSABLE) ×1 IMPLANT
GOWN STRL REUS W/ TWL XL LVL3 (GOWN DISPOSABLE) ×1 IMPLANT
GOWN STRL REUS W/TWL 2XL LVL3 (GOWN DISPOSABLE) IMPLANT
HEMOSTAT POWDER KIT SURGIFOAM (HEMOSTASIS) ×1 IMPLANT
KIT BASIN OR (CUSTOM PROCEDURE TRAY) ×1 IMPLANT
KIT TURNOVER KIT B (KITS) ×1 IMPLANT
NDL HYPO 25X1 1.5 SAFETY (NEEDLE) ×1 IMPLANT
NDL SPNL 20GX3.5 QUINCKE YW (NEEDLE) ×1 IMPLANT
NEEDLE HYPO 25X1 1.5 SAFETY (NEEDLE) ×1 IMPLANT
NEEDLE SPNL 20GX3.5 QUINCKE YW (NEEDLE) ×1 IMPLANT
NS IRRIG 1000ML POUR BTL (IV SOLUTION) ×1 IMPLANT
PACK LAMINECTOMY NEURO (CUSTOM PROCEDURE TRAY) ×1 IMPLANT
PAD ARMBOARD POSITIONER FOAM (MISCELLANEOUS) ×3 IMPLANT
PATTIES SURGICAL .5 X.5 (GAUZE/BANDAGES/DRESSINGS) ×1 IMPLANT
PATTIES SURGICAL 1X1 (DISPOSABLE) ×1 IMPLANT
PIN DISTRACTION 14MM (PIN) ×2 IMPLANT
PLATE ACP INSIG 44 2L (Plate) IMPLANT
PUTTY BONE 2.5CC (Putty) IMPLANT
SCREW VA SINGLE LEAD 4X16 (Screw) IMPLANT
SPACER IDENTITI 8X18X16 7D (Spacer) IMPLANT
SPONGE INTESTINAL PEANUT (DISPOSABLE) ×1 IMPLANT
SPONGE SURGIFOAM ABS GEL SZ50 (HEMOSTASIS) IMPLANT
STRIP CLOSURE SKIN 1/2X4 (GAUZE/BANDAGES/DRESSINGS) ×1 IMPLANT
SUT VIC AB 3-0 SH 8-18 (SUTURE) ×2 IMPLANT
SUT VICRYL 4-0 PS2 18IN ABS (SUTURE) IMPLANT
SYR 30ML SLIP (SYRINGE) ×1 IMPLANT
TOWEL GREEN STERILE (TOWEL DISPOSABLE) ×1 IMPLANT
TOWEL GREEN STERILE FF (TOWEL DISPOSABLE) ×1 IMPLANT
WATER STERILE IRR 1000ML POUR (IV SOLUTION) ×1 IMPLANT

## 2023-12-08 NOTE — Op Note (Signed)
 12/08/2023  9:47 AM  PATIENT:  Erik Bell  63 y.o. male  PRE-OPERATIVE DIAGNOSIS: Cervical spondylosis with cervical spinal stenosis C5-6 C6-7 with neck pain and cervical radiculopathy  POST-OPERATIVE DIAGNOSIS:  same  PROCEDURE:  1. Decompressive anterior cervical discectomy C5-6 C6-7, 2. Anterior cervical arthrodesis C5-6 C6-7 utilizing a PTI interbody cage packed with locally harvested morcellized autologous bone graft and DBM putty 3. Anterior cervical plating C5-7 inclusive utilizing a ATEC plate  SURGEON:  Waymond Hailey, MD  ASSISTANTS: Jackee Marus, FNP  ANESTHESIA:   General  EBL: 50 ml  Total I/O In: 1000 [I.V.:1000] Out: 50 [Blood:50]  BLOOD ADMINISTERED: none  DRAINS: none  SPECIMEN:  none  INDICATION FOR PROCEDURE: This patient presented with neck pain and left more than right arm pain with numbness and tingling in the hands. Imaging showed cervical spondylosis with cervical spinal stenosis C5-6 C6-7. The patient tried conservative measures without relief. Pain was debilitating. Recommended ACDF with plating. Patient understood the risks, benefits, and alternatives and potential outcomes and wished to proceed.  PROCEDURE DETAILS: Patient was brought to the operating room placed under general endotracheal anesthesia. Patient was placed in the supine position on the operating room table. The neck was prepped with Duraprep and draped in a sterile fashion.   Three cc of local anesthesia was injected and a transverse incision was made on the right side of the neck.  Dissection was carried down thru the subcutaneous tissue and the platysma was  elevated, opened, and undermined with Metzenbaum scissors.  Dissection was then carried out thru an avascular plane leaving the sternocleidomastoid carotid artery and jugular vein laterally and the trachea and esophagus medially with the assistance of my nurse practitioner. The ventral aspect of the vertebral column was identified and  a localizing x-ray was taken. The C5-6 level was identified and all in the room agreed with the level. The longus colli muscles were then elevated and the retractor was placed with the assistance of my nurse practitioner to expose C5-6 and C6-7. The annulus was incised and the disc space entered. Discectomy was performed with micro-curettes and pituitary rongeurs. I then used the high-speed drill to drill the endplates down to the level of the posterior longitudinal ligament. The drill shavings were saved in a mucous trap for later arthrodesis. The operating microscope was draped and brought into the field provided additional magnification, illumination and visualization. Discectomy was continued posteriorly thru the disc space. Posterior longitudinal ligament was opened with a nerve hook, and then removed along with disc herniation and osteophytes, decompressing the spinal canal and thecal sac. We then continued to remove osteophytic overgrowth and disc material decompressing the neural foramina and exiting nerve roots bilaterally. The scope was angled up and down to help decompress and undercut the vertebral bodies. Once the decompression was completed we could pass a nerve hook circumferentially to assure adequate decompression in the midline and in the neural foramina. So by both visualization and palpation we felt we had an adequate decompression of the neural elements. We then measured the height of the intravertebral disc space and selected a 8 millimeter PTI interbody cage packed with autograft and DBM putty. It was then gently positioned in the intravertebral disc space(s) and countersunk. I then used a 44 mm plate and placed 16 mm variable angle screws into the vertebral bodies of each level and locked them into position. The wound was irrigated with bacitracin  solution, checked for hemostasis which was established and confirmed. Once meticulous hemostasis  was achieved, we then proceeded with closure with  the assistance of my nurse practitioner. The platysma was closed with interrupted 3-0 undyed Vicryl suture, the subcuticular layer was closed with interrupted 3-0 undyed Vicryl suture. The skin edges were approximated with steristrips. The drapes were removed. A sterile dressing was applied. The patient was then awakened from general anesthesia and transferred to the recovery room in stable condition. At the end of the procedure all sponge, needle and instrument counts were correct.  Nurse practitioner was scrubbed for the entirety of the case and assisted in the exposure, the decompression, the placement of the plate, and the closure.   PLAN OF CARE: Admit for overnight observation  PATIENT DISPOSITION:  PACU - hemodynamically stable.   Delay start of Pharmacological VTE agent (>24hrs) due to surgical blood loss or risk of bleeding:  yes

## 2023-12-08 NOTE — Progress Notes (Signed)
Patient alert and oriented, mae's well, voiding adequate amount of urine, swallowing without difficulty, no c/o pain at time of discharge. Patient discharged home with family. Script and discharged instructions given to patient. Patient and family stated understanding of instructions given. Patient has an appointment with Dr. Lineback °

## 2023-12-08 NOTE — Plan of Care (Signed)
 Problem: Education: Goal: Knowledge of General Education information will improve Description: Including pain rating scale, medication(s)/side effects and non-pharmacologic comfort measures 12/08/2023 1734 by Gregor Learned, RN Outcome: Completed/Met 12/08/2023 1106 by Gregor Learned, RN Outcome: Progressing   Problem: Health Behavior/Discharge Planning: Goal: Ability to manage health-related needs will improve 12/08/2023 1734 by Gregor Learned, RN Outcome: Completed/Met 12/08/2023 1106 by Gregor Learned, RN Outcome: Progressing   Problem: Clinical Measurements: Goal: Ability to maintain clinical measurements within normal limits will improve 12/08/2023 1734 by Gregor Learned, RN Outcome: Completed/Met 12/08/2023 1106 by Gregor Learned, RN Outcome: Progressing Goal: Will remain free from infection 12/08/2023 1734 by Gregor Learned, RN Outcome: Completed/Met 12/08/2023 1106 by Gregor Learned, RN Outcome: Progressing Goal: Diagnostic test results will improve 12/08/2023 1734 by Gregor Learned, RN Outcome: Completed/Met 12/08/2023 1106 by Gregor Learned, RN Outcome: Progressing Goal: Respiratory complications will improve 12/08/2023 1734 by Gregor Learned, RN Outcome: Completed/Met 12/08/2023 1106 by Gregor Learned, RN Outcome: Progressing Goal: Cardiovascular complication will be avoided 12/08/2023 1734 by Gregor Learned, RN Outcome: Completed/Met 12/08/2023 1106 by Gregor Learned, RN Outcome: Progressing   Problem: Activity: Goal: Risk for activity intolerance will decrease 12/08/2023 1734 by Gregor Learned, RN Outcome: Completed/Met 12/08/2023 1106 by Gregor Learned, RN Outcome: Progressing   Problem: Nutrition: Goal: Adequate nutrition will be maintained 12/08/2023 1734 by Gregor Learned, RN Outcome: Completed/Met 12/08/2023 1106 by Gregor Learned, RN Outcome: Progressing   Problem: Coping: Goal: Level of anxiety will decrease 12/08/2023  1734 by Gregor Learned, RN Outcome: Completed/Met 12/08/2023 1106 by Gregor Learned, RN Outcome: Progressing   Problem: Elimination: Goal: Will not experience complications related to bowel motility 12/08/2023 1734 by Gregor Learned, RN Outcome: Completed/Met 12/08/2023 1106 by Gregor Learned, RN Outcome: Progressing Goal: Will not experience complications related to urinary retention 12/08/2023 1734 by Gregor Learned, RN Outcome: Completed/Met 12/08/2023 1106 by Gregor Learned, RN Outcome: Progressing   Problem: Pain Managment: Goal: General experience of comfort will improve and/or be controlled 12/08/2023 1734 by Gregor Learned, RN Outcome: Completed/Met 12/08/2023 1106 by Gregor Learned, RN Outcome: Progressing   Problem: Safety: Goal: Ability to remain free from injury will improve 12/08/2023 1734 by Gregor Learned, RN Outcome: Completed/Met 12/08/2023 1106 by Gregor Learned, RN Outcome: Progressing   Problem: Skin Integrity: Goal: Risk for impaired skin integrity will decrease 12/08/2023 1734 by Gregor Learned, RN Outcome: Completed/Met 12/08/2023 1106 by Gregor Learned, RN Outcome: Progressing   Problem: Education: Goal: Ability to verbalize activity precautions or restrictions will improve 12/08/2023 1734 by Gregor Learned, RN Outcome: Completed/Met 12/08/2023 1106 by Gregor Learned, RN Outcome: Progressing Goal: Knowledge of the prescribed therapeutic regimen will improve 12/08/2023 1734 by Gregor Learned, RN Outcome: Completed/Met 12/08/2023 1106 by Gregor Learned, RN Outcome: Progressing Goal: Understanding of discharge needs will improve 12/08/2023 1734 by Gregor Learned, RN Outcome: Completed/Met 12/08/2023 1106 by Gregor Learned, RN Outcome: Progressing   Problem: Activity: Goal: Ability to avoid complications of mobility impairment will improve 12/08/2023 1734 by Gregor Learned, RN Outcome: Completed/Met 12/08/2023 1106 by  Gregor Learned, RN Outcome: Progressing Goal: Ability to tolerate increased activity will improve 12/08/2023 1734 by Gregor Learned, RN Outcome: Completed/Met 12/08/2023 1106 by Gregor Learned, RN Outcome: Progressing Goal: Will remain free from falls 12/08/2023 1734 by Gregor Learned, RN Outcome: Completed/Met 12/08/2023 1106 by Gregor Learned, RN Outcome: Progressing   Problem: Bowel/Gastric: Goal: Gastrointestinal status for postoperative course will improve 12/08/2023 1734 by Gregor Learned, RN Outcome: Completed/Met 12/08/2023 1106 by Gregor Learned, RN Outcome: Progressing   Problem: Clinical Measurements: Goal: Ability to  maintain clinical measurements within normal limits will improve 12/08/2023 1734 by Gregor Learned, RN Outcome: Completed/Met 12/08/2023 1106 by Gregor Learned, RN Outcome: Progressing Goal: Postoperative complications will be avoided or minimized 12/08/2023 1734 by Gregor Learned, RN Outcome: Completed/Met 12/08/2023 1106 by Gregor Learned, RN Outcome: Progressing Goal: Diagnostic test results will improve 12/08/2023 1734 by Gregor Learned, RN Outcome: Completed/Met 12/08/2023 1106 by Gregor Learned, RN Outcome: Progressing   Problem: Pain Management: Goal: Pain level will decrease 12/08/2023 1734 by Gregor Learned, RN Outcome: Completed/Met 12/08/2023 1106 by Gregor Learned, RN Outcome: Progressing   Problem: Skin Integrity: Goal: Will show signs of wound healing 12/08/2023 1734 by Gregor Learned, RN Outcome: Completed/Met 12/08/2023 1106 by Gregor Learned, RN Outcome: Progressing   Problem: Health Behavior/Discharge Planning: Goal: Identification of resources available to assist in meeting health care needs will improve 12/08/2023 1734 by Gregor Learned, RN Outcome: Completed/Met 12/08/2023 1106 by Gregor Learned, RN Outcome: Progressing   Problem: Bladder/Genitourinary: Goal: Urinary functional status for  postoperative course will improve 12/08/2023 1734 by Gregor Learned, RN Outcome: Completed/Met 12/08/2023 1106 by Gregor Learned, RN Outcome: Progressing

## 2023-12-08 NOTE — Plan of Care (Signed)

## 2023-12-08 NOTE — Evaluation (Signed)
 Occupational Therapy Evaluation Patient Details Name: Erik Bell MRN: [id number] DOB: 1961/03/03 Today's Date: 12/08/2023   History of Present Illness   23 you M s/p ACDF.  PMH includes: HTN, CAD, GB     Clinical Impressions Patient admitted for the procedure above.  PTA he lives at home with his spouse, who can assist as needed.  Patient is doing quite well needing no assist with ADL completion, is up and walking ad lib in the halls, and was able to climb stairs without assist.  Patient has a good understanding of all precautions, and no further rehab is needed in the acute setting.  Recommend follow up as prescribed by MD.       If plan is discharge home, recommend the following:   Assist for transportation     Functional Status Assessment   Patient has not had a recent decline in their functional status     Equipment Recommendations   None recommended by OT     Recommendations for Other Services         Precautions/Restrictions   Precautions Precautions: Cervical Precaution Booklet Issued: Yes (comment) Recall of Precautions/Restrictions: Intact Restrictions Weight Bearing Restrictions Per Provider Order: No     Mobility Bed Mobility Overal bed mobility: Modified Independent                  Transfers Overall transfer level: Modified independent Equipment used: None                      Balance Overall balance assessment: Mild deficits observed, not formally tested                                         ADL either performed or assessed with clinical judgement   ADL Overall ADL's : Modified independent                                             Vision Patient Visual Report: No change from baseline       Perception Perception: Not tested       Praxis Praxis: Not tested       Pertinent Vitals/Pain Pain Assessment Pain Assessment: Faces Faces Pain Scale: Hurts little more Pain  Location: Incisional Pain Descriptors / Indicators: Operative site guarding Pain Intervention(s): Monitored during session     Extremity/Trunk Assessment Upper Extremity Assessment Upper Extremity Assessment: Overall WFL for tasks assessed   Lower Extremity Assessment Lower Extremity Assessment: Overall WFL for tasks assessed   Cervical / Trunk Assessment Cervical / Trunk Assessment: Neck Surgery   Communication Communication Communication: No apparent difficulties   Cognition Arousal: Alert Behavior During Therapy: WFL for tasks assessed/performed Cognition: No apparent impairments                               Following commands: Intact       Cueing  General Comments   Cueing Techniques: Verbal cues   VSS on RA   Exercises     Shoulder Instructions      Home Living Family/patient expects to be discharged to:: Private residence Living Arrangements: Spouse/significant other Available Help at Discharge: Family;Available 24 hours/day Type of Home: House Home Access:  Level entry     Home Layout: Two level;Able to live on main level with bedroom/bathroom Alternate Level Stairs-Number of Steps: full flight Alternate Level Stairs-Rails: Right Bathroom Shower/Tub: Producer, television/film/video: Standard Bathroom Accessibility: Yes How Accessible: Accessible via walker Home Equipment: None          Prior Functioning/Environment Prior Level of Function : Independent/Modified Independent;Driving                    OT Problem List: Pain   OT Treatment/Interventions:        OT Goals(Current goals can be found in the care plan section)   Acute Rehab OT Goals Patient Stated Goal: Return home OT Goal Formulation: With patient Time For Goal Achievement: 12/25/23 Potential to Achieve Goals: Good   OT Frequency:       Co-evaluation              AM-PAC OT "6 Clicks" Daily Activity     Outcome Measure Help from another  person eating meals?: None Help from another person taking care of personal grooming?: None Help from another person toileting, which includes using toliet, bedpan, or urinal?: None Help from another person bathing (including washing, rinsing, drying)?: None Help from another person to put on and taking off regular upper body clothing?: None Help from another person to put on and taking off regular lower body clothing?: None 6 Click Score: 24   End of Session Nurse Communication: Mobility status  Activity Tolerance: Patient tolerated treatment well Patient left: in chair;with call bell/phone within reach  OT Visit Diagnosis: Pain Pain - part of body:  (Incisional)                Time: 1610-9604 OT Time Calculation (min): 22 min Charges:  OT General Charges $OT Visit: 1 Visit OT Evaluation $OT Eval Moderate Complexity: 1 Mod  12/08/2023  RP, OTR/L  Acute Rehabilitation Services  Office:  [phone]   Benjamen Brand 12/08/2023, 1:45 PM

## 2023-12-08 NOTE — Discharge Summary (Signed)
 Physician Discharge Summary  Patient ID: Erik Bell MRN: [id number] DOB/AGE: March 11, 1961 63 y.o.  Admit date: 12/08/2023 Discharge date: 12/08/2023  Admission Diagnoses: cervical stenosis    Discharge Diagnoses: same   Discharged Condition: good  Hospital Course: The patient was admitted on 12/08/2023 and taken to the operating room where the patient underwent ACDF . The patient tolerated the procedure well and was taken to the recovery room and then to the floor in stable condition. The hospital course was routine. There were no complications. The wound remained clean dry and intact. Pt had appropriate neck soreness. No complaints of arm pain or new N/T/W. The patient remained afebrile with stable vital signs, and tolerated a regular diet. The patient continued to increase activities, and pain was well controlled with oral pain medications.   Consults: None  Significant Diagnostic Studies:  Results for orders placed or performed during the hospital encounter of 12/08/23  Protime-INR   Collection Time: 12/08/23  5:51 AM  Result Value Ref Range   Prothrombin Time 15.1 11.4 - 15.2 seconds   INR 1.2 0.8 - 1.2    DG Cervical Spine 2 or 3 views Result Date: 12/08/2023 CLINICAL DATA:  Anterior cervical discectomy and fusion C5-7 EXAM: CERVICAL SPINE - 3 VIEW COMPARISON:  Cervical spine radiograph dated 10/03/2023 FINDINGS: Three fluoroscopic images obtained during anterior cervical discectomy and fusion spanning C5-7. 14.3 seconds fluoro time utilized. Radiation dose 1.7 mGy Kerma. Please see performing physicians operative report for full details. IMPRESSION: Fluoroscopic images were obtained for intraoperative guidance of anterior cervical discectomy and fusion spanning C5-7. Electronically Signed   By: Limin  Xu M.D.   On: 12/08/2023 13:56   DG C-Arm 1-60 Min-No Report Result Date: 12/08/2023 Fluoroscopy was utilized by the requesting physician.  No radiographic interpretation.   DG  C-Arm 1-60 Min-No Report Result Date: 12/08/2023 Fluoroscopy was utilized by the requesting physician.  No radiographic interpretation.   DG C-Arm 1-60 Min-No Report Result Date: 12/08/2023 Fluoroscopy was utilized by the requesting physician.  No radiographic interpretation.    Antibiotics:  Anti-infectives (From admission, onward)    Start     Dose/Rate Route Frequency Ordered Stop   12/08/23 1600  ceFAZolin  (ANCEF ) IVPB 2g/100 mL premix        2 g 200 mL/hr over 30 Minutes Intravenous Every 8 hours 12/08/23 1042 12/09/23 0759   12/08/23 0600  ceFAZolin  (ANCEF ) IVPB 2g/100 mL premix        2 g 200 mL/hr over 30 Minutes Intravenous On call to O.R. 12/08/23 0550 12/08/23 0743   12/08/23 0554  ceFAZolin  (ANCEF ) 2-4 GM/100ML-% IVPB       Note to Pharmacy: Gwynne Less: cabinet override      12/08/23 0554 12/08/23 0752       Discharge Exam: Blood pressure (!) 145/71, pulse 83, temperature 98.5 F (36.9 C), temperature source Oral, resp. rate 20, height 5\' 11"  (1.803 m), weight 99.8 kg, SpO2 95%. Neurologic: Grossly normal Dressing dry  Discharge Medications:   Allergies as of 12/08/2023       Reactions   Latex Rash        Medication List     STOP taking these medications    traMADol  50 MG tablet Commonly known as: ULTRAM        TAKE these medications    amLODipine 10 MG tablet Commonly known as: NORVASC Take 10 mg by mouth daily.   atorvastatin 40 MG tablet Commonly known as: LIPITOR Take 40 mg by mouth  daily.   chlorthalidone 50 MG tablet Commonly known as: HYGROTON Take 50 mg by mouth daily.   Clobetasol Propionate 0.05 % external spray Commonly known as: TEMOVATE Apply 1 Application topically 2 (two) times daily as needed (Psoriasis).   methocarbamol  500 MG tablet Commonly known as: ROBAXIN  Take 1 tablet (500 mg total) by mouth every 6 (six) hours as needed for muscle spasms.   multivitamin with minerals Tabs tablet Take 1 tablet by mouth  daily.   oxyCODONE  5 MG immediate release tablet Commonly known as: Oxy IR/ROXICODONE  Take 1 tablet (5 mg total) by mouth every 4 (four) hours as needed for moderate pain (pain score 4-6).   telmisartan 80 MG tablet Commonly known as: MICARDIS Take 80 mg by mouth daily.        Disposition: home   Final Dx: ACDF c5-6 C6-7  Discharge Instructions      Remove dressing in 72 hours   Complete by: As directed    Call MD for:  difficulty breathing, headache or visual disturbances   Complete by: As directed    Call MD for:  persistant nausea and vomiting   Complete by: As directed    Call MD for:  redness, tenderness, or signs of infection (pain, swelling, redness, odor or green/yellow discharge around incision site)   Complete by: As directed    Call MD for:  severe uncontrolled pain   Complete by: As directed    Call MD for:  temperature >100.4   Complete by: As directed    Diet - low sodium heart healthy   Complete by: As directed    Increase activity slowly   Complete by: As directed           Signed: Isadora Mar 12/08/2023, 4:49 PM

## 2023-12-08 NOTE — Anesthesia Procedure Notes (Signed)
 Procedure Name: Intubation Date/Time: 12/08/2023 7:35 AM  Performed by: Inez Manger, CRNAPre-anesthesia Checklist: Patient identified, Emergency Drugs available, Suction available and Patient being monitored Patient Re-evaluated:Patient Re-evaluated prior to induction Oxygen Delivery Method: Circle System Utilized Preoxygenation: Pre-oxygenation with 100% oxygen Induction Type: IV induction Ventilation: Mask ventilation without difficulty Laryngoscope Size: Glidescope and 3 Grade View: Grade I Tube type: Oral Tube size: 7.5 mm Number of attempts: 1 Airway Equipment and Method: Stylet, Oral airway and Video-laryngoscopy Placement Confirmation: ETT inserted through vocal cords under direct vision, positive ETCO2 and breath sounds checked- equal and bilateral Secured at: 23 cm Tube secured with: Tape Dental Injury: Teeth and Oropharynx as per pre-operative assessment

## 2023-12-08 NOTE — Transfer of Care (Cosign Needed)
 Immediate Anesthesia Transfer of Care Note  Patient: Erik Bell  Procedure(s) Performed: ANTERIOR CERVICAL DECOMPRESSION/DISCECTOMY FUSION CERVICAL FIVE-CERVICAL SIX - CERVICAL SIX-CERVICAL SEVEN  Patient Location: PACU  Anesthesia Type:General  Level of Consciousness: awake and alert   Airway & Oxygen Therapy: Patient Spontanous Breathing and Patient connected to nasal cannula oxygen  Post-op Assessment: Report given to RN and Post -op Vital signs reviewed and stable  Post vital signs: Reviewed and stable  Last Vitals:  Vitals Value Taken Time  BP 107/69 12/08/23 1000  Temp 36.7 C 12/08/23 1000  Pulse 82 12/08/23 1004  Resp 14 12/08/23 1004  SpO2 95 % 12/08/23 1004  Vitals shown include unfiled device data.  Last Pain:  Vitals:   12/08/23 1000  TempSrc:   PainSc: 0-No pain         Complications: No notable events documented.

## 2023-12-08 NOTE — H&P (Signed)
 Subjective:   Patient is a 63 y.o. male admitted for neck and arm pain. The patient first presented to me with complaints of neck pain, shooting pains in the arm(s), and numbness of the arm(s). Onset of symptoms was several months ago. The pain is described as aching and occurs all day. The pain is rated severe, and is located in the neck and radiates to the LUE. The symptoms have been progressive. Symptoms are exacerbated by extending head backwards, and are relieved by none.  Previous work up includes MRI of cervical spine, results: spinal stenosis.  Past Medical History:  Diagnosis Date   Basal cell carcinoma 06/20/2017   Left side nose   CAD in native artery    Guillain Barr syndrome [facility])    age 7   Hypertension    Sleep apnea     Past Surgical History:  Procedure Laterality Date   CARDIAC CATHETERIZATION  08/22/2022   LM & RCA < 30%, LAD 30-50% proximal & mid-distal LAD, ~ 50% ostial D1, mild LCX, medical therapy   COLONOSCOPY     KNEE SURGERY     rotator cuff repair Right    2018   WRIST SURGERY      No Known Allergies  Social History   Tobacco Use   Smoking status: Never   Smokeless tobacco: Never  Substance Use Topics   Alcohol use: Yes    Comment: rare    Family History  Problem Relation Age of Onset   Hypertension Other    Cancer Other    Prior to Admission medications   Medication Sig Start Date End Date Taking? Authorizing Provider  amLODipine  (NORVASC ) 10 MG tablet Take 10 mg by mouth daily.   Yes [provider]  atorvastatin (LIPITOR) 10 MG tablet Take 40 mg by mouth daily. 03/13/20  Yes [provider]  chlorthalidone  (HYGROTON ) 50 MG tablet Take 50 mg by mouth daily.   Yes [provider]  Clobetasol Propionate (TEMOVATE) 0.05 % external spray Apply 1 Application topically 2 (two) times daily as needed (Psoriasis). 11/07/19  Yes [provider]  Multiple Vitamin (MULTIVITAMIN WITH MINERALS) TABS tablet Take 1 tablet by  mouth daily.   Yes [provider]  telmisartan (MICARDIS) 80 MG tablet Take 80 mg by mouth daily.   Yes [provider]  traMADol  (ULTRAM ) 50 MG tablet Take 1 tablet (50 mg total) by mouth every 6 (six) hours as needed. 03/08/17  Yes Guadalupe Lee, MD     Review of Systems  Positive ROS: neg  All other systems have been reviewed and were otherwise negative with the exception of those mentioned in the HPI and as above.  Objective: Vital signs in last 24 hours: Temp:  [97.9 F (36.6 C)] 97.9 F (36.6 C) (04/25 0604) Pulse Rate:  [70] 70 (04/25 0604) Resp:  [16] 16 (04/25 0604) BP: (133)/(83) 133/83 (04/25 0604) SpO2:  [97 %] 97 % (04/25 0604) Weight:  [99.8 kg] 99.8 kg (04/25 0604)  General Appearance: Alert, cooperative, no distress, appears stated age Head: Normocephalic, without obvious abnormality, atraumatic Eyes: PERRL, conjunctiva/corneas clear, EOM's intact      Neck: Supple, symmetrical, trachea midline, Back: Symmetric, no curvature, ROM normal, no CVA tenderness Lungs:  respirations unlabored Heart: Regular rate and rhythm Abdomen: Soft, non-tender Extremities: Extremities normal, atraumatic, no cyanosis or edema Pulses: 2+ and symmetric all extremities Skin: Skin color, texture, turgor normal, no rashes or lesions  NEUROLOGIC:  Mental status: Alert and oriented x4,  no aphasia, good attention span, fund of knowledge and memory  Motor Exam - grossly normal Sensory Exam - grossly normal Reflexes: 1+ Coordination - grossly normal Gait - grossly normal Balance - grossly normal Cranial Nerves: I: smell Not tested  II: visual acuity  OS: nl    OD: nl  II: visual fields Full to confrontation  II: pupils Equal, round, reactive to light  III,VII: ptosis None  III,IV,VI: extraocular muscles  Full ROM  V: mastication Normal  V: facial light touch sensation  Normal  V,VII: corneal reflex  Present  VII: facial muscle function - upper  Normal  VII:  facial muscle function - lower Normal  VIII: hearing Not tested  IX: soft palate elevation  Normal  IX,X: gag reflex Present  XI: trapezius strength  5/5  XI: sternocleidomastoid strength 5/5  XI: neck flexion strength  5/5  XII: tongue strength  Normal    Data Review Lab Results  Component Value Date   WBC 10.4 11/30/2023   HGB 14.0 11/30/2023   HCT 39.5 11/30/2023   MCV 88.8 11/30/2023   PLT 242 11/30/2023   Lab Results  Component Value Date   NA 136 11/30/2023   K 3.2 (L) 11/30/2023   CL 101 11/30/2023   CO2 24 11/30/2023   BUN 22 11/30/2023   CREATININE 1.00 11/30/2023   GLUCOSE 120 (H) 11/30/2023   No results found for: "INR", "PROTIME"  Assessment:   Cervical neck pain with herniated nucleus pulposus/ spondylosis/ stenosis at C5-6 C6-7. Estimated body mass index is 30.68 kg/m as calculated from the following:   Height as of this encounter: 5\' 11"  (1.803 m).   Weight as of this encounter: 99.8 kg.  Patient has failed conservative therapy. Planned surgery : ACDF C5-6 C6-7  Plan:   I explained the condition and procedure to the patient and answered any questions.  Patient wishes to proceed with procedure as planned. Understands risks/ benefits/ and expected or typical outcomes.  Isadora Mar 12/08/2023 7:26 AM

## 2023-12-09 NOTE — Anesthesia Postprocedure Evaluation (Signed)
 Anesthesia Post Note  Patient: ASHELY PERSSON  Procedure(s) Performed: ANTERIOR CERVICAL DECOMPRESSION/DISCECTOMY FUSION CERVICAL FIVE-CERVICAL SIX - CERVICAL SIX-CERVICAL SEVEN     Patient location during evaluation: PACU Anesthesia Type: General Level of consciousness: awake and alert Pain management: pain level controlled Vital Signs Assessment: post-procedure vital signs reviewed and stable Respiratory status: spontaneous breathing, nonlabored ventilation, respiratory function stable and patient connected to nasal cannula oxygen Cardiovascular status: blood pressure returned to baseline and stable Postop Assessment: no apparent nausea or vomiting Anesthetic complications: no   No notable events documented.  Last Vitals:  Vitals:   12/08/23 1054 12/08/23 1638  BP: 127/66 (!) 145/71  Pulse: 74 83  Resp: 20 20  Temp: 36.7 C 36.9 C  SpO2: 97% 95%    Last Pain:  Vitals:   12/08/23 1638  TempSrc: Oral  PainSc:                  Valente Gaskin Xaiden Fleig

## 2023-12-12 ENCOUNTER — Encounter (HOSPITAL_COMMUNITY): Payer: Self-pay | Admitting: Neurological Surgery
